# Patient Record
Sex: Male | Born: 1980 | Race: Black or African American | Hispanic: No | Marital: Single
Health system: Southern US, Community
[De-identification: ages and names within clinical notes are randomized; demographics above are authoritative.]

---

## 2008-02-23 ENCOUNTER — Emergency Department (HOSPITAL_COMMUNITY): Admission: EM | Admit: 2008-02-23 | Discharge: 2008-02-23 | Payer: Self-pay | Admitting: Emergency Medicine
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
## 2022-02-01 ENCOUNTER — Emergency Department
Admission: EM | Admit: 2022-02-01 | Discharge: 2022-02-01 | Disposition: A | Discharge status: Home / Self Care | Payer: PRIVATE HEALTH INSURANCE | Attending: Student in an Organized Health Care Education/Training Program | Admitting: Student in an Organized Health Care Education/Training Program

## 2022-02-01 ENCOUNTER — Emergency Department: Payer: PRIVATE HEALTH INSURANCE

## 2022-02-01 ENCOUNTER — Other Ambulatory Visit: Payer: Self-pay

## 2022-02-01 DIAGNOSIS — Y92410 Unspecified street and highway as the place of occurrence of the external cause: Secondary | ICD-10-CM | POA: Insufficient documentation

## 2022-02-01 DIAGNOSIS — S060X1A Concussion with loss of consciousness of 30 minutes or less, initial encounter: Secondary | ICD-10-CM | POA: Diagnosis not present

## 2022-02-01 DIAGNOSIS — M542 Cervicalgia: Secondary | ICD-10-CM | POA: Insufficient documentation

## 2022-02-01 DIAGNOSIS — S0990XA Unspecified injury of head, initial encounter: Secondary | ICD-10-CM | POA: Diagnosis present

## 2022-02-01 MED ORDER — CYCLOBENZAPRINE HCL 10 MG PO TABS: 10.0000 mg | ORAL_TABLET | Freq: Three times a day (TID) | ORAL | 0 refills | Status: AC | PRN

## 2022-02-01 MED ORDER — ACETAMINOPHEN 500 MG PO TABS
1000.0000 mg | ORAL_TABLET | Freq: Once | ORAL | Status: AC
  Administered 2022-02-01: 1000 mg via ORAL
  Filled 2022-02-01: qty 2

## 2022-02-01 MED ORDER — NAPROXEN 500 MG PO TABS: 500.0000 mg | ORAL_TABLET | Freq: Two times a day (BID) | ORAL | 11 refills | Status: AC

## 2022-02-01 NOTE — Discharge Instructions (Addendum)
-  You may treat pain with Tylenol and naproxen as needed.  You may utilize cyclobenzaprine as well as a muscle relaxer, though use caution as it may make you drowsy.  -Please review the educational material in regards to concussion management.  Avoid activities that exacerbate your symptoms.  Please follow-up with your primary care provider as needed.  -Return to the emergency department anytime if you begin to experience any new or worsening symptoms.
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
